# Patient Record
Sex: Male | Born: 1995 | Race: White | Hispanic: Yes | Marital: Single | Smoking: Current every day smoker
Health system: Southern US, Community
[De-identification: ages and names within clinical notes are randomized; demographics above are authoritative.]

---
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
## 2019-06-25 ENCOUNTER — Emergency Department (HOSPITAL_COMMUNITY): Payer: Self-pay

## 2019-06-25 ENCOUNTER — Encounter (HOSPITAL_COMMUNITY): Payer: Self-pay | Admitting: Emergency Medicine

## 2019-06-25 ENCOUNTER — Emergency Department (HOSPITAL_COMMUNITY)
Admission: EM | Admit: 2019-06-25 | Discharge: 2019-06-26 | Disposition: A | Discharge status: Home / Self Care | Payer: Self-pay | Attending: Emergency Medicine | Admitting: Emergency Medicine

## 2019-06-25 ENCOUNTER — Other Ambulatory Visit: Payer: Self-pay

## 2019-06-25 DIAGNOSIS — Y999 Unspecified external cause status: Secondary | ICD-10-CM | POA: Insufficient documentation

## 2019-06-25 DIAGNOSIS — F17210 Nicotine dependence, cigarettes, uncomplicated: Secondary | ICD-10-CM | POA: Insufficient documentation

## 2019-06-25 DIAGNOSIS — Y9289 Other specified places as the place of occurrence of the external cause: Secondary | ICD-10-CM | POA: Insufficient documentation

## 2019-06-25 DIAGNOSIS — X501XXA Overexertion from prolonged static or awkward postures, initial encounter: Secondary | ICD-10-CM | POA: Insufficient documentation

## 2019-06-25 DIAGNOSIS — Y9389 Activity, other specified: Secondary | ICD-10-CM | POA: Insufficient documentation

## 2019-06-25 DIAGNOSIS — S82831A Other fracture of upper and lower end of right fibula, initial encounter for closed fracture: Secondary | ICD-10-CM | POA: Insufficient documentation

## 2019-06-25 NOTE — ED Triage Notes (Signed)
C/o R ankle pain and swelling since jumping a ditch yesterday.

## 2019-06-26 MED ORDER — HYDROCODONE-ACETAMINOPHEN 5-325 MG PO TABS
2.0000 | ORAL_TABLET | Freq: Once | ORAL | Status: AC
  Administered 2019-06-26: 2 via ORAL
  Filled 2019-06-26: qty 2

## 2019-06-26 MED ORDER — IBUPROFEN 800 MG PO TABS: 800.0000 mg | ORAL_TABLET | Freq: Three times a day (TID) | ORAL | 0 refills | Status: AC

## 2019-06-26 MED ORDER — HYDROCODONE-ACETAMINOPHEN 5-325 MG PO TABS: 1.0000 | ORAL_TABLET | Freq: Four times a day (QID) | ORAL | 0 refills | Status: AC | PRN

## 2019-06-26 NOTE — ED Provider Notes (Addendum)
[facility]   CSN: [id number] Arrival date & time: 06/25/19  2300    History   Chief Complaint Chief Complaint  Patient presents with  . Ankle Pain    HPI Randy Torres is a 23 y.o. male with a hx of no major medical problems presents to the Emergency Department complaining of acute, persistent, right ankle pain onset >24 hours ago.  Pt reports his friend jumped over a culvert and pt thought he would be able to do so as well, but fell, inverting the right ankle.  Pt reports immediate pain and difficulty ambulating.  Pt denies hitting his head of LOC.  He denies neck pain, back pain, hip or knee pain.  Pt reports using icy hot with minimal relief.  He has associated swelling of the ankle.  Attempting to ambulate makes the symptoms significantly worse.          The history is provided by the patient and medical records. No language interpreter was used.    History reviewed. No pertinent past medical history.  There are no active problems to display for this patient.   History reviewed. No pertinent surgical history.      Home Medications    Prior to Admission medications   Medication Sig Start Date End Date Taking? Authorizing Provider  famotidine (PEPCID) 20 MG tablet Take 1 tablet (20 mg total) by mouth 2 (two) times daily. 03/15/14   Blane OharaZavitz, Joshua, MD  HYDROcodone-acetaminophen (NORCO/VICODIN) 5-325 MG tablet Take 1-2 tablets by mouth every 6 (six) hours as needed for severe pain. 06/26/19   Odalis Jordan, Dahlia ClientHannah, PA-C  ibuprofen (ADVIL) 800 MG tablet Take 1 tablet (800 mg total) by mouth 3 (three) times daily. 06/26/19   Kymora Sciara, Dahlia ClientHannah, PA-C  ondansetron (ZOFRAN ODT) 4 MG disintegrating tablet 4mg  ODT q4 hours prn nausea/vomit 03/15/14   Blane OharaZavitz, Joshua, MD    Family History No family history on file.  Social History Social History   Tobacco Use  . Smoking status: Current Every Day Smoker  . Smokeless tobacco:  Never Used  Substance Use Topics  . Alcohol use: No    Comment: quit drinking liquor 2 months ago  . Drug use: Yes    Types: Marijuana     Allergies   Patient has no known allergies.   Review of Systems Review of Systems  Constitutional: Negative for chills and fever.  Gastrointestinal: Negative for nausea and vomiting.  Musculoskeletal: Positive for arthralgias, gait problem and joint swelling. Negative for back pain, neck pain and neck stiffness.  Skin: Negative for wound.  Neurological: Negative for numbness.  Hematological: Does not bruise/bleed easily.  Psychiatric/Behavioral: The patient is not nervous/anxious.   All other systems reviewed and are negative.    Physical Exam Updated Vital Signs BP (!) 132/98 (BP Location: Right Arm)   Pulse 84   Temp 98.8 F (37.1 C) (Oral)   Resp 19   SpO2 99%   Physical Exam Vitals signs and nursing note reviewed.  Constitutional:      General: He is not in acute distress.    Appearance: He is well-developed. He is not diaphoretic.  HENT:     Head: Normocephalic and atraumatic.  Eyes:     Conjunctiva/sclera: Conjunctivae normal.  Neck:     Musculoskeletal: Normal range of motion.  Cardiovascular:     Rate and Rhythm: Normal rate and regular rhythm.     Comments: Capillary refill < 3 sec Pulmonary:  Effort: Pulmonary effort is normal.     Breath sounds: Normal breath sounds.  Musculoskeletal:        General: Tenderness present.     Right hip: Normal.     Right knee: Normal.     Right ankle: He exhibits decreased range of motion, swelling and ecchymosis. He exhibits no laceration and normal pulse. Tenderness. Lateral malleolus and medial malleolus tenderness found. Achilles tendon exhibits no pain and no defect.     Right foot: Swelling present. No tenderness or bony tenderness.     Comments: Significant swelling and ecchymosis of the right ankle extending into the right forefoot.  No tenderness to the foot or toes.  Lateral tenderness over the malleolus.  Full range of motion of the right knee.  No tenderness to palpation.  Specifically no tenderness at the head of the fibula. Compartments soft.  Skin:    General: Skin is warm and dry.     Comments: No tenting of the skin  Neurological:     Mental Status: He is alert.     Coordination: Coordination normal.     Comments: Sensation intact to normal touch throughout the right lower extremity Strength 5/5 at the right hip, knee and great toe.  4/5 at the ankle due to pain.      ED Treatments / Results   Radiology Dg Ankle Complete Right  Result Date: 06/26/2019 CLINICAL DATA:  Fall with ankle pain EXAM: RIGHT ANKLE - COMPLETE 3+ VIEW COMPARISON:  None. FINDINGS: Severe circumferential soft tissue swelling of the right ankle. There is a mildly posteriorly displaced fracture of the distal right fibula in oblique orientation. Small posterior ankle effusion. No tibia fracture. IMPRESSION: Mildly displaced fracture of the distal right fibula with severe circumferential soft tissue swelling. Electronically Signed   By: Ulyses Jarred M.D.   On: 06/26/2019 00:11    Procedures Procedures (including critical care time)  Medications Ordered in ED Medications  HYDROcodone-acetaminophen (NORCO/VICODIN) 5-325 MG per tablet 2 tablet (has no administration in time range)     Initial Impression / Assessment and Plan / ED Course  I have reviewed the triage vital signs and the nursing notes.  Pertinent labs & imaging results that were available during my care of the patient were reviewed by me and considered in my medical decision making (see chart for details).         Patient X-Ray with mildly displaced fracture of the distal right fibula with significant tissue swelling.  I personally evaluated these images.  No obvious fracture of the tibia.  Pain managed in ED. Pt advised to follow up with orthopedics for further evaluation and treatment.  Pain managed in  the department. Patient given cam walker and crutches while in ED, conservative therapy recommended and discussed. Patient will be dc home & is agreeable with above plan. I have also discussed reasons to return immediately to the ER.  Patient expresses understanding and agrees with plan.    Final Clinical Impressions(s) / ED Diagnoses   Final diagnoses:  Closed fracture of distal end of right fibula, unspecified fracture morphology, initial encounter    ED Discharge Orders         Ordered    HYDROcodone-acetaminophen (NORCO/VICODIN) 5-325 MG tablet  Every 6 hours PRN     06/26/19 0055    ibuprofen (ADVIL) 800 MG tablet  3 times daily     06/26/19 0055           Yuriko Portales, Jarrett Soho, PA-C  06/26/19 0105    Dione BoozeGlick, David, MD 06/26/19 [phone]

## 2019-06-26 NOTE — Discharge Instructions (Addendum)
1. Medications: alternate naprosyn and tylenol for pain control, Vicodin for breakthrough pain, DO NOT exceed 4000mg  of Tylenol in 24 hours; take usual home medications 2. Treatment: rest, ice, elevate and use brace and crutches, drink plenty of fluids, gentle stretching 3. Follow Up: Please followup with orthopedics as directed in 1 week if no improvement for discussion of your diagnoses and further evaluation after today's visit; Please return to the ER for worsening symptoms or other concerns

## 2019-06-26 NOTE — ED Notes (Signed)
The pt has extreme swelling to his rt foot and ankle he fell yesterday

## 2020-07-14 IMAGING — DX RIGHT ANKLE - COMPLETE 3+ VIEW
3 series · 3 of 3 positions shown · non-contrast
Comparison: None.

CLINICAL DATA: Fall with ankle pain

EXAM:
RIGHT ANKLE - COMPLETE 3+ VIEW

[ankle ap]
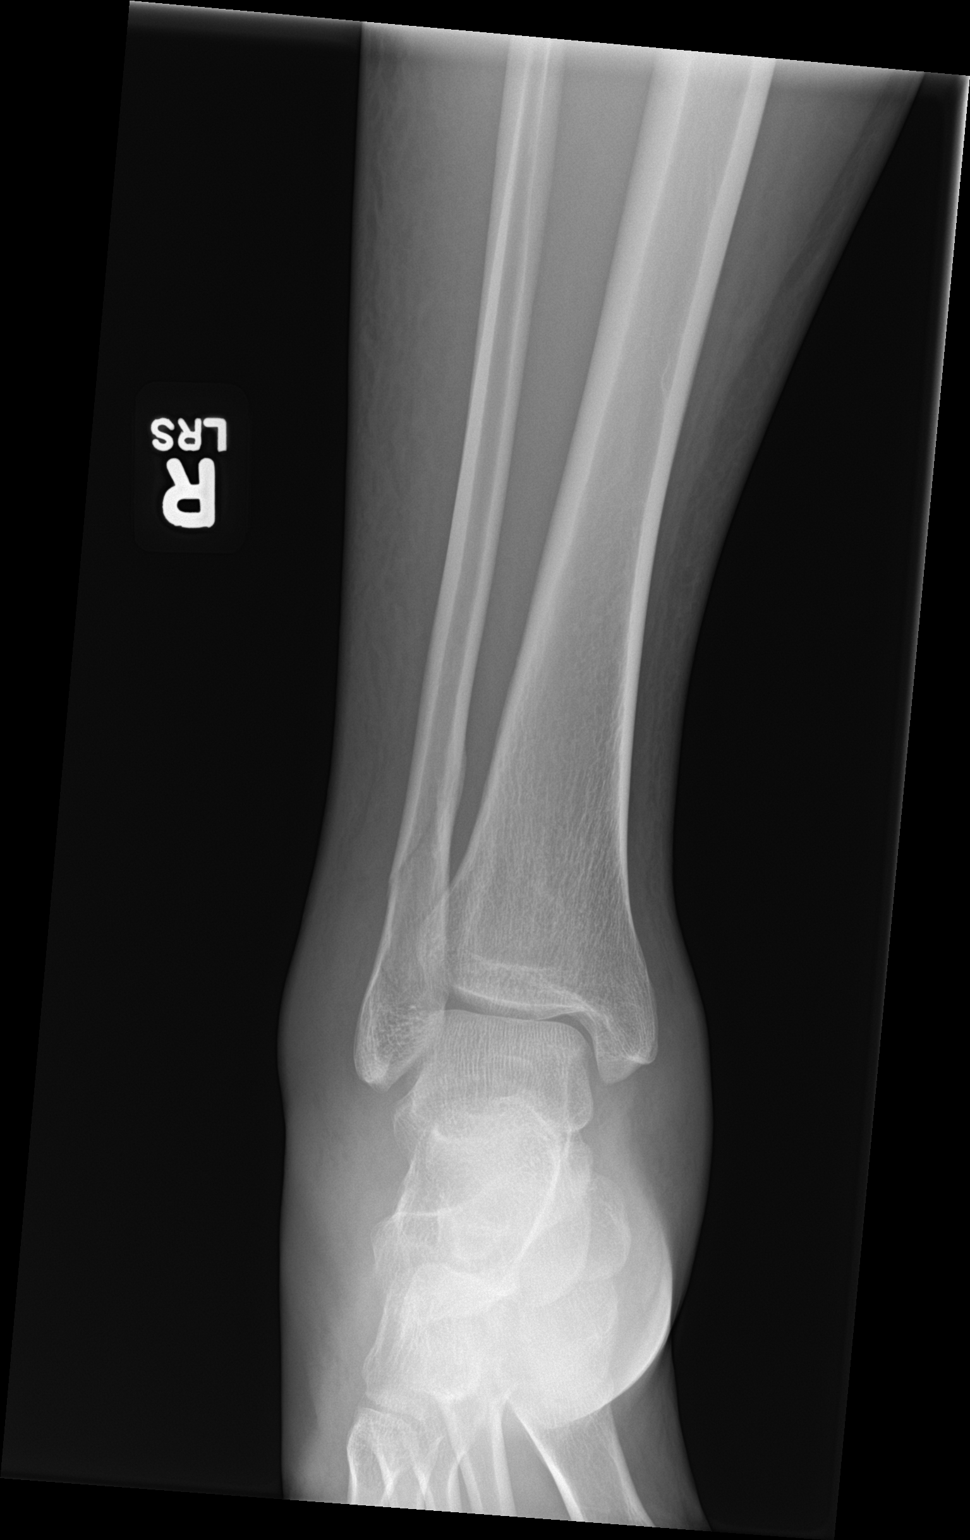

[ankle obl]
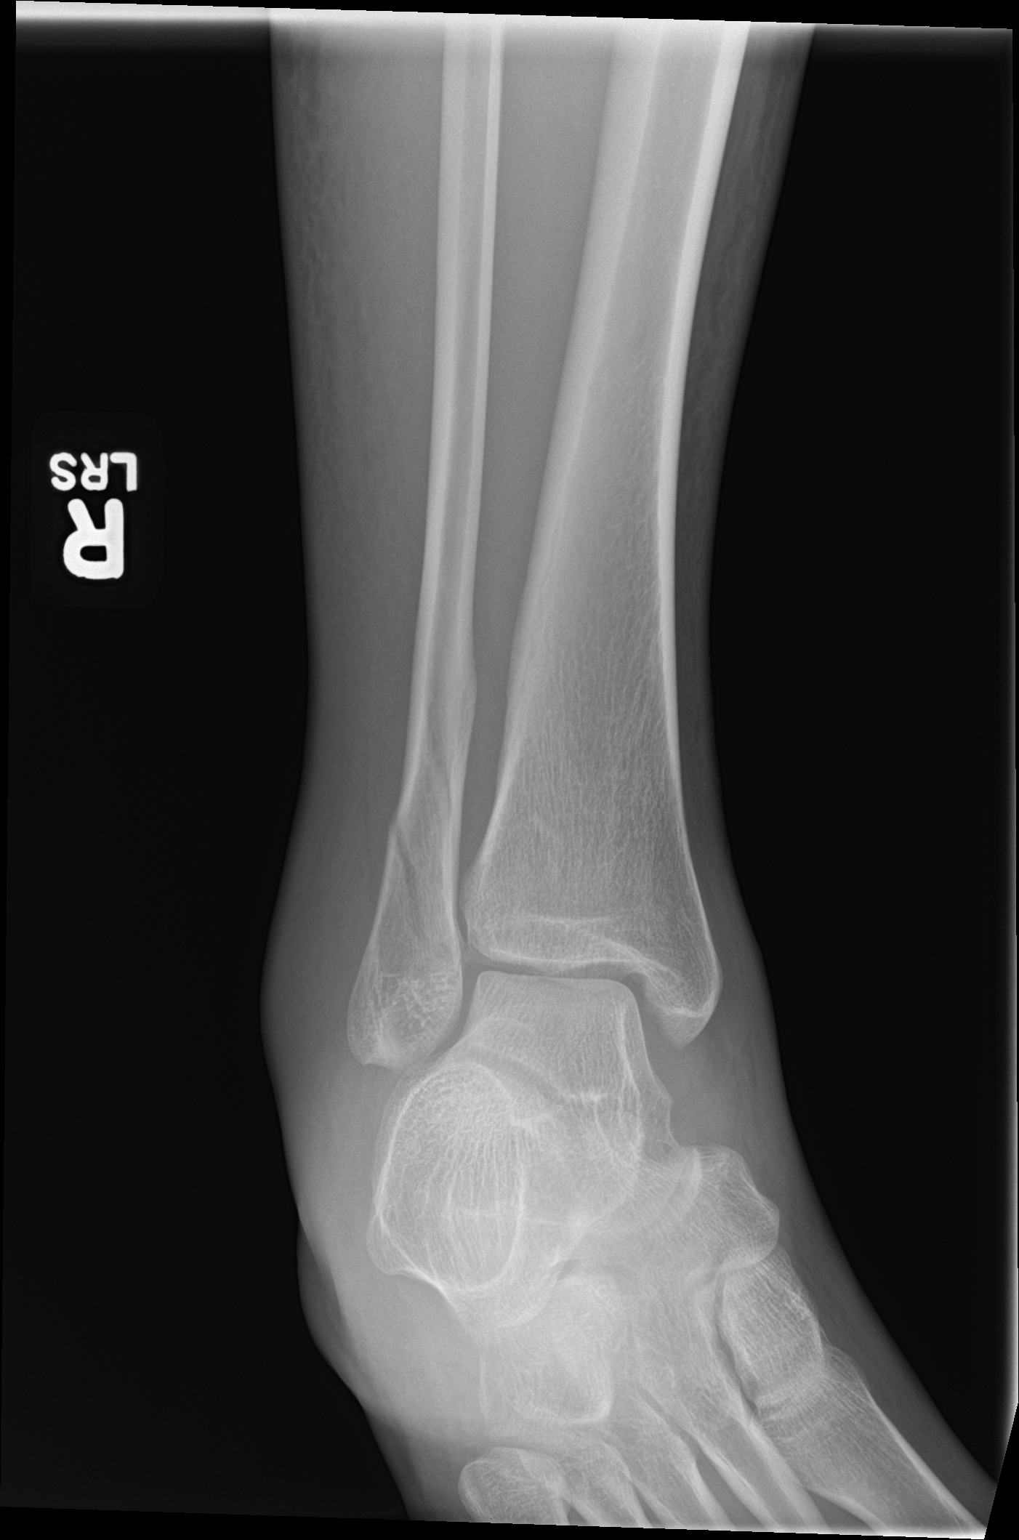

[ankle lat]
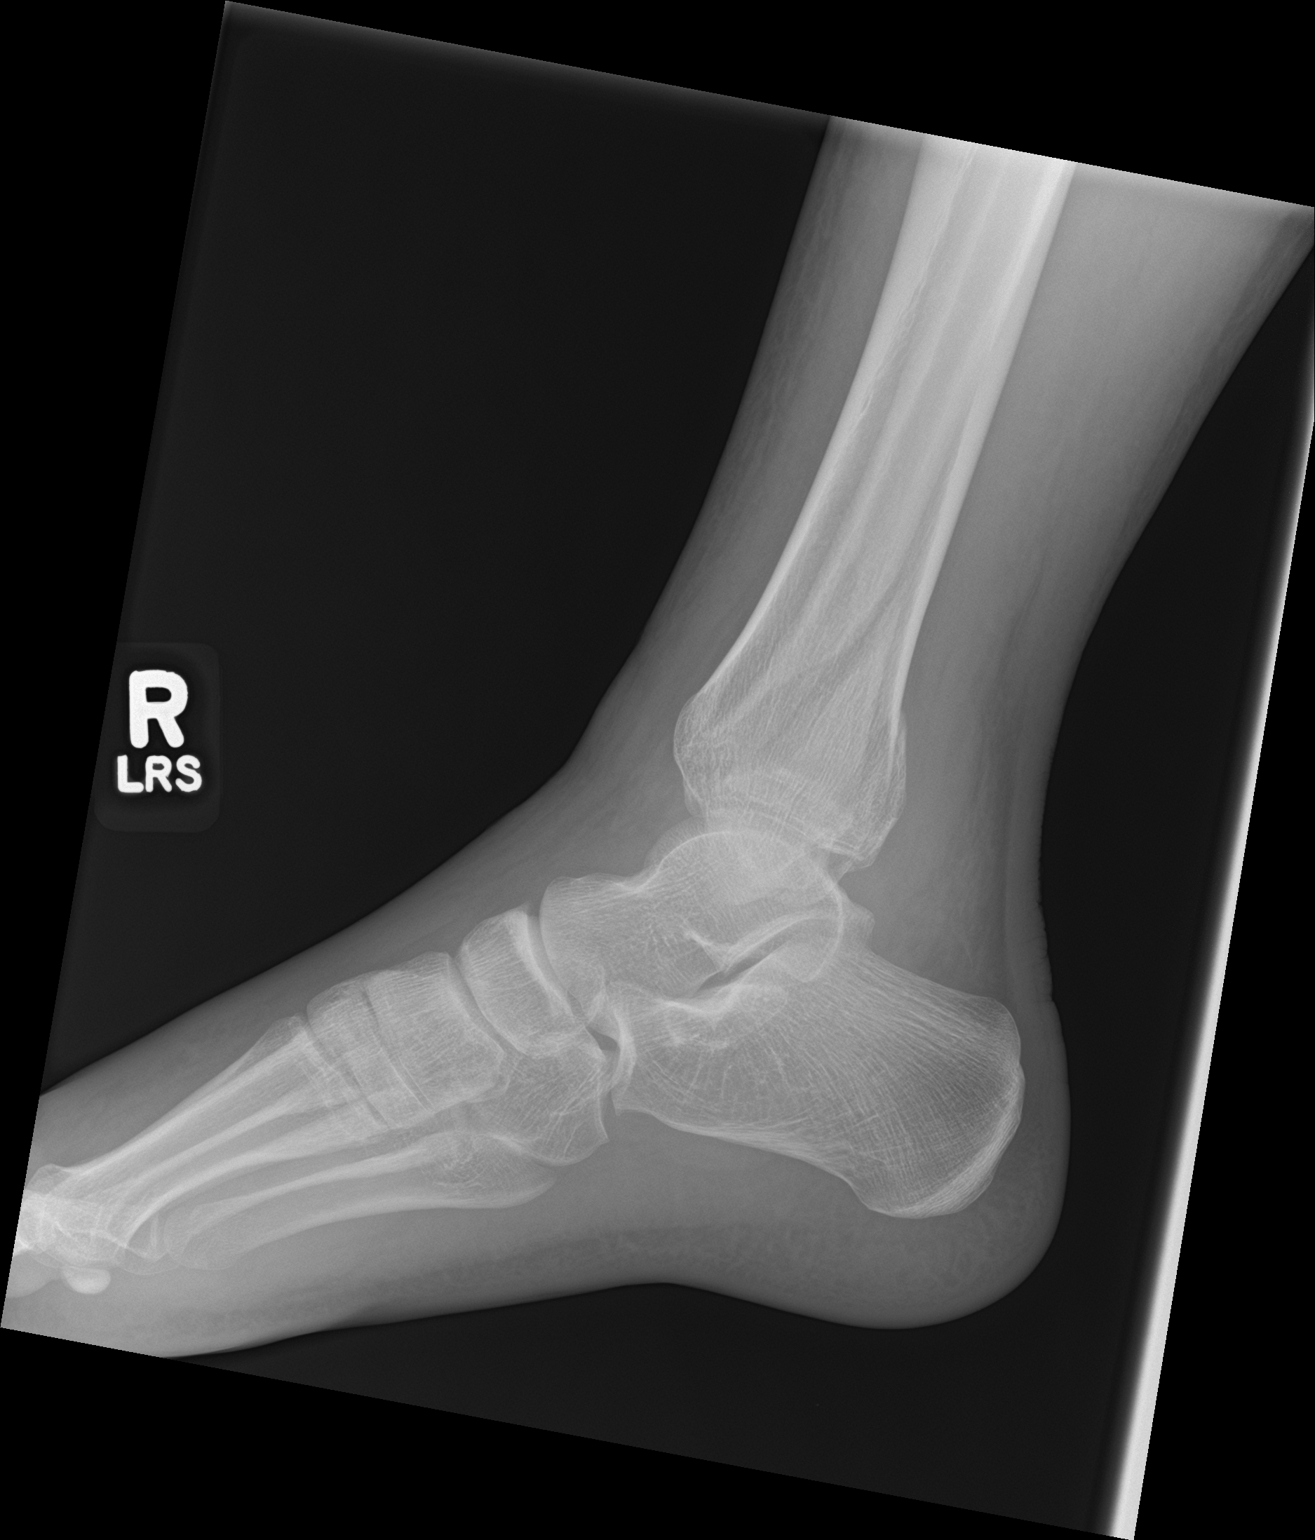

[3 of 3 positions shown; findings below may reference images not displayed]

FINDINGS: Severe circumferential soft tissue swelling of the right ankle.
There is a mildly posteriorly displaced fracture of the distal right
fibula in oblique orientation. Small posterior ankle effusion. No
tibia fracture.
IMPRESSION: Mildly displaced fracture of the distal right fibula with severe
circumferential soft tissue swelling.
